# Patient Record
Sex: Male | Born: 1960 | Marital: Single
Health system: Southern US, Community
[De-identification: ages and names within clinical notes are randomized; demographics above are authoritative.]

---

## 2012-09-26 ENCOUNTER — Ambulatory Visit: Payer: Self-pay | Admitting: Internal Medicine

## 2012-09-27 ENCOUNTER — Inpatient Hospital Stay: Payer: Self-pay | Admitting: Internal Medicine

## 2012-09-27 DIAGNOSIS — J9620 Acute and chronic respiratory failure, unspecified whether with hypoxia or hypercapnia: Secondary | ICD-10-CM

## 2012-09-27 DIAGNOSIS — I509 Heart failure, unspecified: Secondary | ICD-10-CM

## 2012-09-27 LAB — CK TOTAL AND CKMB (NOT AT ARMC)
CK, Total: 154 U/L (ref 35–232)
CK-MB: 2.3 ng/mL (ref 0.5–3.6)

## 2012-09-27 LAB — URINALYSIS, COMPLETE
Blood: NEGATIVE
Glucose,UR: NEGATIVE mg/dL (ref 0–75)
Ketone: NEGATIVE
Leukocyte Esterase: NEGATIVE
Nitrite: NEGATIVE
Ph: 7 (ref 4.5–8.0)
Protein: 100
RBC,UR: <1 /HPF (ref 0–5)
Specific Gravity: 1.015 (ref 1.003–1.030)
Squamous Epithelial: NONE SEEN
WBC UR: 1 /HPF (ref 0–5)

## 2012-09-27 LAB — COMPREHENSIVE METABOLIC PANEL
Anion Gap: 4 — ABNORMAL LOW (ref 7–16)
BUN: 10 mg/dL (ref 7–18)
Bilirubin,Total: 0.6 mg/dL (ref 0.2–1.0)
Calcium, Total: 8.5 mg/dL (ref 8.5–10.1)
Co2: 32 mmol/L (ref 21–32)
EGFR (Non-African Amer.): >60
Glucose: 104 mg/dL — ABNORMAL HIGH (ref 65–99)
Osmolality: 247 (ref 275–301)
SGOT(AST): 69 U/L — ABNORMAL HIGH (ref 15–37)
Sodium: 123 mmol/L — ABNORMAL LOW (ref 136–145)

## 2012-09-27 LAB — CBC WITH DIFFERENTIAL/PLATELET
Basophil %: 1 %
Eosinophil #: 0 10*3/uL (ref 0.0–0.7)
HCT: 40.8 % (ref 40.0–52.0)
HGB: 13.6 g/dL (ref 13.0–18.0)
Lymphocyte %: 12.8 %
MCH: 27.2 pg (ref 26.0–34.0)
MCHC: 33.3 g/dL (ref 32.0–36.0)
Neutrophil #: 3.9 10*3/uL (ref 1.4–6.5)
Neutrophil %: 72.8 %
Platelet: 443 10*3/uL — ABNORMAL HIGH (ref 150–440)
RDW: 15.6 % — ABNORMAL HIGH (ref 11.5–14.5)

## 2012-09-27 LAB — TROPONIN I
Troponin-I: <0.02 ng/mL
Troponin-I: 0.03 ng/mL

## 2012-09-27 LAB — CREATININE, URINE, RANDOM: Creatinine, Urine Random: 111.2 mg/dL (ref 30.0–125.0)

## 2012-09-27 LAB — TSH: Thyroid Stimulating Horm: 2.27 u[IU]/mL

## 2012-09-27 LAB — PROTIME-INR: INR: 1.1

## 2012-09-27 LAB — PRO B NATRIURETIC PEPTIDE: B-Type Natriuretic Peptide: 4037 pg/mL — ABNORMAL HIGH (ref 0–125)

## 2012-09-27 LAB — APTT: Activated PTT: 32 secs (ref 23.6–35.9)

## 2012-09-27 LAB — SODIUM, URINE, RANDOM: Sodium, Urine Random: 103 mmol/L (ref 20–110)

## 2012-09-27 LAB — SEDIMENTATION RATE: Erythrocyte Sed Rate: 6 mm/hr (ref 0–20)

## 2012-09-27 LAB — RAPID HIV-1/2 QL/CONFIRM: HIV-1/2,Rapid Ql: NEGATIVE

## 2012-09-28 DIAGNOSIS — I279 Pulmonary heart disease, unspecified: Secondary | ICD-10-CM

## 2012-09-28 LAB — CBC WITH DIFFERENTIAL/PLATELET
Basophil #: 0 10*3/uL (ref 0.0–0.1)
Eosinophil #: 0 10*3/uL (ref 0.0–0.7)
HCT: 38.5 % — ABNORMAL LOW (ref 40.0–52.0)
Lymphocyte %: 3 %
Monocyte #: 0.1 x10 3/mm — ABNORMAL LOW (ref 0.2–1.0)
Monocyte %: 0.9 %
Neutrophil %: 95.6 %
Platelet: 474 10*3/uL — ABNORMAL HIGH (ref 150–440)
RBC: 4.59 10*6/uL (ref 4.40–5.90)
WBC: 6.4 10*3/uL (ref 3.8–10.6)

## 2012-09-28 LAB — BASIC METABOLIC PANEL
Anion Gap: 0 — ABNORMAL LOW (ref 7–16)
Calcium, Total: 8.5 mg/dL (ref 8.5–10.1)
Chloride: 91 mmol/L — ABNORMAL LOW (ref 98–107)
Co2: 37 mmol/L — ABNORMAL HIGH (ref 21–32)
Creatinine: 0.61 mg/dL (ref 0.60–1.30)
EGFR (African American): >60
Glucose: 136 mg/dL — ABNORMAL HIGH (ref 65–99)
Osmolality: 257 (ref 275–301)
Potassium: 4.4 mmol/L (ref 3.5–5.1)

## 2012-09-28 LAB — HEMOGLOBIN A1C: Hemoglobin A1C: 7.5 % — ABNORMAL HIGH (ref 4.2–6.3)

## 2012-09-28 LAB — LIPID PANEL
Cholesterol: 128 mg/dL (ref 0–200)
Ldl Cholesterol, Calc: 85 mg/dL (ref 0–100)
Triglycerides: 45 mg/dL (ref 0–200)
VLDL Cholesterol, Calc: 9 mg/dL (ref 5–40)

## 2012-09-28 LAB — CK TOTAL AND CKMB (NOT AT ARMC): CK-MB: 2.4 ng/mL (ref 0.5–3.6)

## 2012-09-28 LAB — TROPONIN I: Troponin-I: <0.02 ng/mL

## 2012-09-28 LAB — MAGNESIUM: Magnesium: 1.9 mg/dL

## 2012-09-29 LAB — URINALYSIS, COMPLETE
Bacteria: NONE SEEN
Bilirubin,UR: NEGATIVE
Blood: NEGATIVE
Glucose,UR: NEGATIVE mg/dL (ref 0–75)
Ph: 5 (ref 4.5–8.0)
WBC UR: <1 /HPF (ref 0–5)

## 2012-09-29 LAB — BASIC METABOLIC PANEL
BUN: 15 mg/dL (ref 7–18)
Chloride: 94 mmol/L — ABNORMAL LOW (ref 98–107)
EGFR (African American): >60
EGFR (Non-African Amer.): >60
Osmolality: 269 (ref 275–301)
Potassium: 4.3 mmol/L (ref 3.5–5.1)

## 2012-09-29 LAB — CBC WITH DIFFERENTIAL/PLATELET
Comment - H1-Com3: NORMAL
HCT: 36.6 % — ABNORMAL LOW (ref 40.0–52.0)
HGB: 12 g/dL — ABNORMAL LOW (ref 13.0–18.0)
Lymphocytes: 7 %
MCH: 26.8 pg (ref 26.0–34.0)
MCHC: 32.8 g/dL (ref 32.0–36.0)
MCV: 82 fL (ref 80–100)
Monocytes: 4 %
NRBC/100 WBC: 1 /
RDW: 15.8 % — ABNORMAL HIGH (ref 11.5–14.5)
Segmented Neutrophils: 89 %
WBC: 7.7 10*3/uL (ref 3.8–10.6)

## 2012-09-29 LAB — HEPATIC FUNCTION PANEL A (ARMC)
Alkaline Phosphatase: 151 U/L — ABNORMAL HIGH (ref 50–136)
Bilirubin,Total: 0.5 mg/dL (ref 0.2–1.0)
SGOT(AST): 25 U/L (ref 15–37)
SGPT (ALT): 60 U/L (ref 12–78)
Total Protein: 7.5 g/dL (ref 6.4–8.2)

## 2012-09-29 LAB — MAGNESIUM: Magnesium: 1.9 mg/dL

## 2012-09-29 LAB — PHOSPHORUS: Phosphorus: 2.3 mg/dL — ABNORMAL LOW (ref 2.5–4.9)

## 2012-09-30 LAB — MAGNESIUM: Magnesium: 1.9 mg/dL

## 2012-09-30 LAB — PHOSPHORUS: Phosphorus: 2.8 mg/dL (ref 2.5–4.9)

## 2012-10-01 LAB — BASIC METABOLIC PANEL
Anion Gap: 5 — ABNORMAL LOW (ref 7–16)
BUN: 24 mg/dL — ABNORMAL HIGH (ref 7–18)
Calcium, Total: 8 mg/dL — ABNORMAL LOW (ref 8.5–10.1)
Chloride: 100 mmol/L (ref 98–107)
Co2: 31 mmol/L (ref 21–32)
EGFR (African American): >60
EGFR (Non-African Amer.): >60
Osmolality: 280 (ref 275–301)
Potassium: 3.8 mmol/L (ref 3.5–5.1)

## 2012-10-01 LAB — CBC WITH DIFFERENTIAL/PLATELET
Basophil #: 0 10*3/uL (ref 0.0–0.1)
Basophil %: 0.5 %
HCT: 35.6 % — ABNORMAL LOW (ref 40.0–52.0)
Lymphocyte %: 4.1 %
MCH: 26.6 pg (ref 26.0–34.0)
MCHC: 32.7 g/dL (ref 32.0–36.0)
MCV: 81 fL (ref 80–100)
Monocyte #: 0.7 x10 3/mm (ref 0.2–1.0)
Monocyte %: 7.8 %
Neutrophil #: 8.2 10*3/uL — ABNORMAL HIGH (ref 1.4–6.5)
Neutrophil %: 87.6 %
Platelet: 432 10*3/uL (ref 150–440)
RDW: 16.6 % — ABNORMAL HIGH (ref 11.5–14.5)
WBC: 9.4 10*3/uL (ref 3.8–10.6)

## 2012-10-03 LAB — CBC WITH DIFFERENTIAL/PLATELET
Basophil %: 0.1 %
Eosinophil #: 0 10*3/uL (ref 0.0–0.7)
Eosinophil %: 0 %
HCT: 38.1 % — ABNORMAL LOW (ref 40.0–52.0)
HGB: 12.2 g/dL — ABNORMAL LOW (ref 13.0–18.0)
Lymphocyte %: 1.8 %
MCH: 27 pg (ref 26.0–34.0)
MCHC: 32 g/dL (ref 32.0–36.0)
Neutrophil #: 15.2 10*3/uL — ABNORMAL HIGH (ref 1.4–6.5)
Neutrophil %: 91.3 %
Platelet: 414 10*3/uL (ref 150–440)
RDW: 16.3 % — ABNORMAL HIGH (ref 11.5–14.5)
WBC: 16.6 10*3/uL — ABNORMAL HIGH (ref 3.8–10.6)

## 2012-10-03 LAB — BASIC METABOLIC PANEL
Calcium, Total: 8.8 mg/dL (ref 8.5–10.1)
Chloride: 93 mmol/L — ABNORMAL LOW (ref 98–107)
Creatinine: 0.61 mg/dL (ref 0.60–1.30)
EGFR (Non-African Amer.): >60
Glucose: 142 mg/dL — ABNORMAL HIGH (ref 65–99)

## 2012-10-03 LAB — MAGNESIUM: Magnesium: 2.2 mg/dL

## 2012-10-04 LAB — BASIC METABOLIC PANEL
BUN: 22 mg/dL — ABNORMAL HIGH (ref 7–18)
Calcium, Total: 8.6 mg/dL (ref 8.5–10.1)
Co2: 44 mmol/L (ref 21–32)
Creatinine: 0.48 mg/dL — ABNORMAL LOW (ref 0.60–1.30)
Osmolality: 274 (ref 275–301)
Potassium: 4 mmol/L (ref 3.5–5.1)

## 2012-10-05 LAB — BASIC METABOLIC PANEL
BUN: 21 mg/dL — ABNORMAL HIGH (ref 7–18)
Calcium, Total: 8.5 mg/dL (ref 8.5–10.1)
Chloride: 92 mmol/L — ABNORMAL LOW (ref 98–107)
Co2: >45 mmol/L (ref 21–32)
Creatinine: 0.62 mg/dL (ref 0.60–1.30)
EGFR (African American): >60
EGFR (Non-African Amer.): >60
Glucose: 164 mg/dL — ABNORMAL HIGH (ref 65–99)

## 2012-10-05 LAB — CBC WITH DIFFERENTIAL/PLATELET
Basophil #: 0 10*3/uL (ref 0.0–0.1)
Eosinophil %: 0.1 %
HCT: 39.6 % — ABNORMAL LOW (ref 40.0–52.0)
Lymphocyte %: 10.4 %
MCH: 27 pg (ref 26.0–34.0)
MCHC: 31.2 g/dL — ABNORMAL LOW (ref 32.0–36.0)
MCV: 86 fL (ref 80–100)
Monocyte #: 1.5 x10 3/mm — ABNORMAL HIGH (ref 0.2–1.0)
Monocyte %: 18.8 %
Platelet: 387 10*3/uL (ref 150–440)

## 2012-10-05 LAB — TROPONIN I: Troponin-I: <0.02 ng/mL

## 2012-10-08 LAB — BASIC METABOLIC PANEL
Chloride: 97 mmol/L — ABNORMAL LOW (ref 98–107)
Co2: 44 mmol/L (ref 21–32)
Creatinine: 0.55 mg/dL — ABNORMAL LOW (ref 0.60–1.30)
EGFR (African American): >60
EGFR (Non-African Amer.): >60
Glucose: 95 mg/dL (ref 65–99)
Osmolality: 279 (ref 275–301)

## 2012-10-08 LAB — CBC WITH DIFFERENTIAL/PLATELET
Basophil #: 0 10*3/uL (ref 0.0–0.1)
Eosinophil #: 0.1 10*3/uL (ref 0.0–0.7)
HGB: 12.3 g/dL — ABNORMAL LOW (ref 13.0–18.0)
Lymphocyte #: 1 10*3/uL (ref 1.0–3.6)
MCH: 27 pg (ref 26.0–34.0)
MCHC: 31.3 g/dL — ABNORMAL LOW (ref 32.0–36.0)
MCV: 86 fL (ref 80–100)
Monocyte #: 0.9 x10 3/mm (ref 0.2–1.0)
Platelet: 328 10*3/uL (ref 150–440)
RBC: 4.54 10*6/uL (ref 4.40–5.90)
WBC: 7.5 10*3/uL (ref 3.8–10.6)

## 2012-10-27 ENCOUNTER — Ambulatory Visit: Payer: Self-pay | Admitting: Internal Medicine

## 2014-06-18 NOTE — H&P (Signed)
PATIENT NAME:  Ronald Galvan, Ronald Galvan MR#:  [id number] DATE OF BIRTH:  Oct 05, 1960  DATE OF ADMISSION:  09/27/2012  PRIMARY CARE PHYSICIAN: Nonlocal.  REFERRING PHYSICIAN: Arman Filter, MD   CHIEF COMPLAINT: Shortness of breath.   HISTORY OF PRESENT ILLNESS: The patient is a 54 year old African-American male with history of asthma, hypertension, GERD, who is visiting from another part of [place]. He came in through the ER after experiencing some weakness and shortness of breath yesterday. The patient stated that he has been having weight loss of about 20 pounds in the last couple of years, without cough, night sweats, fevers, chills or rigors. He has been worked up as an outpatient and has had a couple of HIV tests this year, per him, both negative. "They don't know what's going on with me." In the meantime, he is explaining dyspnea on exertion and occasionally cannot even go up a flight of stairs. He has no productive cough. He has no significant lower extremity edema. He tires easily. He has been having increased shortness of breath for the last 3 days or so, again without fevers, coughs. There are no sick contacts or recent antibiotics. There is occasional wheezing. Yesterday, he felt significantly worse and had dizziness, sweats and was short of breath. He also stated that he had perhaps 1- to 2-second slurred speech, which improved by itself, without any associated weakness. He came into the hospital today. While he was in the waiting room, per ER physician, his pulse oximetry was 60%. He was brought in and placed on 3 liters of oxygen, and he is better. Workup done thus far showed a BNP of 4000, sodium of 123 and magnesium of 1.2, and hospitalist services were contacted for further evaluation and management. Also, he had a D-dimer of 2.9, and a CAT scan has been ordered and done, result pending.   PAST MEDICAL HISTORY: Hypertension, asthma, GERD.   PAST SURGICAL HISTORY: Denies.   FAMILY HISTORY:  Cancer of the breast in the mom, prostate issues in male members of his family.   ALLERGIES: None.   SOCIAL HISTORY: Denies tobacco, alcohol or drug use. Works as a Journalist, newspaper.   OUTPATIENT MEDICATIONS: He is on:  1. Advair 250/50 mcg 1 puff 2 times a day.  2. Nexium 20 mg daily. 3. One blood pressure medication, he does not know what.   REVIEW OF SYSTEMS:  CONSTITUTIONAL: A 20-pound weight loss or so in the last 2 to 3 years, without night sweats, fevers, chills. He generally has poor appetite.  EYES: No blurry vision, double vision, redness or cataracts.  EARS, NOSE, THROAT: No tinnitus, hearing loss. Had some dry throat in the last week or so.   RESPIRATORY: No cough. Occasional wheezing. Has significant dyspnea on exertion. No history of COPD. Does have history of asthma. No painful respirations.  CARDIOVASCULAR: Denies chest pains. No orthopnea. No swelling in the legs. No arrhythmia. Has significant dyspnea on exertion. No palpitations. Has high blood pressure. Had an issue with the heart where he has seen a cardiologist in the past, and the cardiologist told him he had some scarring around his heart, questionable infection, but was not given any antibiotics and was not hospitalized.  GASTROINTESTINAL: Has no nausea or vomiting. Has constipation. No abdominal pain. No rectal bleeding or melena.  GENITOURINARY: He has no dysuria or hematuria.  HEMATOLOGIC AND LYMPHATIC: No anemia or easy bruising.  SKIN: No rashes.  MUSCULOSKELETAL: Denies arthritis or gout.  NEUROLOGIC: Denies focal  weakness, numbness, stroke or TIA.  PSYCHIATRIC: Has anxiety.   PHYSICAL EXAMINATION:  VITAL SIGNS: Temperature on arrival is 97.6, initial pulse rate was 85, while I was in the room it was in the low 100s, respiratory rate 18, blood pressure on arrival 164/93, O2 saturation charted here was 87% on room air, but per Dr. Renard Hamper, ER physician, he was about 60 while waiting in the  waiting room.  GENERAL: The patient is a cachectic-appearing thin African-American male lying in bed flat, without obvious distress.  HEENT: Normocephalic, atraumatic. Pupils are equal and reactive. Anicteric sclerae. Extraocular muscles intact. There is some temporal wasting. Moist mucous membranes. There is no evidence of thrush.  NECK: Supple. No thyroid tenderness, but there is significant distention of neck veins and some JVD.  CARDIOVASCULAR: S1, S2, tachycardic. No significant murmurs, rubs or gallops appreciated.  LUNGS: There are decreased breath sounds in all fields. No significant wheezing or crackles, but the hyperdynamic heart sounds are heard throughout the lung fields.  ABDOMEN: Soft, nontender, nondistended. Positive bowel sounds in all quadrants.  EXTREMITIES: No significant lower extremity edema.  SKIN: No obvious rashes. NEUROLOGIC: Cranial nerves II through XII grossly intact. Strength is 5 out of 5 in all extremities. Sensation is intact to light touch.  PSYCHIATRIC: Awake, alert, oriented x3. Cooperative, pleasant.  LABORATORY DATA: BNP 4037. Glucose 104, BUN 10, creatinine 0.55, sodium 123, potassium 4.8, chloride 87, magnesium 1.2, anion gap of 4, total protein 9.2, serum albumin 2.7, alkaline phosphatase 191, AST 69, ALT 91. Troponin negative. TSH 2.27. WBC 5.4. ESR is 6. Hemoglobin 13.6, platelets are 443. D-dimer 2.9. INR 1.1. UA not suggestive of infection. ABG on 2 liters of oxygen showed pH of 7.24, pCO2 of 80, pO2 of 81. CT of the brain without contrast showed involutional changes without evidence of focal acute abnormality. X-ray of the chest shows findings likely representing interstitial fibrosis. No focal regions of consolidation appreciated. EKG: Sinus rate of 89. Bilateral atrial enlargement, pulmonary disease pattern. There are some T wave inversions in inferior leads and some also in V3, no acute ST elevations. Prolonged QT interval.   ASSESSMENT AND PLAN: We have  a 54 year old with hypertension, asthma, gastroesophageal reflux disease, with what appears to be chronic and progressive shortness of breath and dyspnea on exertion, with weight loss, who presents with chronic shortness of breath and more acute worsening in the last 3 days associated with dizziness and shortness of breath. I did attempt to call his clinic, but they were closed, but I got in touch with a PA, who was unfortunately not familiar with the patient and could not give me any records, medical conditions or outpatient medications. He appears to have acute on possibly chronic hypercapnic hypoxic respiratory failure. X-ray of the chest shows fibrosis. At this point, respiratory failure is of unknown etiology, but workup is being done. Amongst the differential are such things as Eisenmenger's syndrome, some type of pericarditis, human immunodeficiency virus and Pneumocystis pneumonia and pulmonary fibrosis. Also possible is a pulmonary embolism.   In regards to the acute on chronic hypercapnic hypoxic respiratory failure, I would supplement oxygen. He does not appear to be grossly volume overloaded to me on exam, but has diminished breath sounds and no significant lower extremity edema. He has need of extensive workup, including following up the CAT scan. I would admit the patient to telemetry, obtain a cardiology consult, obtain an echocardiogram. I would also consider low-dose Lasix if needed, but the patient has had  poor p.o. intake and has some hyponatremia, and that needs to be worked up too. In the meantime, for HIV, I have ordered HIV antibodies, and also a CD4 count has been ordered, but it cannot be done until Monday. Of note, the patient has had cachexia, weight loss and hypoxia which is severe, but lung x-ray shows more of a pulmonary fibrosis, and per patient, he has had 2 negative HIV tests, and that is less likely on the differential. Interstitial pulmonary fibrosis is a possibility. I would  follow with the CAT scan and obtain a pulmonary consult. I would also send in labs, including ANCA, ANA, ACE level, supply oxygen, and I have ordered a BiPAP. Will also continue with Advair. The patient might need steroids if this is interstitial fibrosis. I would also recheck another ABG and lactate later today and consider BiPAP at that time.   The patient does have hyponatremia. He has had poor p.o. intake for the last couple of days, but I suspect there is chronicity of this as well. The patient appears to be more of euvolemic to slightly hypervolemic and might consider giving the patient Lasix. We will go ahead and send in serum and urine OSMs, calculate fractional excretion of sodium, check a urine sodium and creatinine. As I believe that this is more of a chronic issue perhaps, I would not be in haste to give the patient IV fluids at this point or significant doses of Lasix. I would consider low-dose Lasix as the patient does have some elements of congestive heart failure, which could be driven by the lungs and pulmonary fibrosis. I would replete magnesium and recheck another in the morning. In regards to the high blood pressure, he does not know what medication he is on. I would consider starting low-dose beta blocker or an ACE inhibitor. Continue his PPI. I will start him on heparin for deep venous thrombosis prophylaxis. The patient does have significant malnutrition, and I would think it is at least moderate. I would obtain a dietary consult. He has elevated LFTs, which could be secondary to hepatic congestion and right-sided heart failure. We would monitor them at this point.   TOTAL CRITICAL CARE TIME SPENT: 75 minutes.   CODE STATUS: The patient is full code.   ____________________________ Vivien Presto, MD sa:OSi D: 09/27/2012 14:08:01 ET T: 09/27/2012 14:55:35 ET JOB#: [id number]  cc: Vivien Presto, MD, <Dictator> Vivien Presto MD ELECTRONICALLY SIGNED 10/19/2012 12:27

## 2014-06-18 NOTE — Consult Note (Signed)
Brief Consult Note: Diagnosis: Severe lung disease with likely pulmonary fibrosis, Low CD4, + SPEP with monoclonal IGG, + RF.   Patient was seen by consultant.   Consult note dictated.   Recommend further assessment or treatment.   Orders entered.   Discussed with Attending MD.   Comments: Low Cd4 count - % is normal - likley due to acute illness  - rec repeat cd4 Severe lung disease - doubt active infeciton - may have been related to prior infection or chemical exposure. For the + IGG monoclonal spike on spep could consider Onc consult.  Electronic Signatures: Dierdre HarnessFitzgerald, Sahirah Rudell Patrick (MD)  (Signed 12-Aug-14 13:00)  Authored: Brief Consult Note   Last Updated: 12-Aug-14 13:00 by Dierdre HarnessFitzgerald, Aella Ronda Patrick (MD)

## 2014-06-18 NOTE — Consult Note (Signed)
PATIENT NAME:  Ronald Galvan, Ronald Galvan MR#:  [id number] DATE OF BIRTH:  12-26-1960  DATE OF CONSULTATION:  10/07/2012  REFERRING PHYSICIAN: Dr. Boyce Medici CONSULTING PHYSICIAN:  Cheral Marker. Ola Spurr, MD  PULMONOLOGIST: Dr. Mortimer Fries.  REASON FOR CONSULTATION: Severe pneumonia as well as low CD4 count.   HISTORY OF PRESENT ILLNESS: This is an unfortunate 54 year old nonsmoker, who was admitted 08/02 with complaints of shortness of breath and weakness. He had also had a slow progressive weight loss over several years. When he was admitted, he was found to be dyspneic and had hypoxia to 60% on room air. He was admitted for further management.   Since that time, the patient has had a rocky course with his pulmonary status having great difficulty ventilating requiring intermittent BiPAP. He has been seen by Dr. Raul Del and then by Dr. Mortimer Fries currently. He was on BiPAP last night.   When I spoke with the patient this morning he is a relatively poor historian. I asked how long he has been having his symptoms and he says he is only felt "funky" for a few days and that is what brought him in.  On further discussion when asked about his dyspnea on exertion, it has been going on for at least several months. He denies any night sweats or fevers. He denies any real productive cough. He does have intermittent headaches. He denies any other symptoms.   PAST MEDICAL HISTORY:  1.  Hypertension.  2.  Asthma.  3.  GERD.   PAST SURGICAL HISTORY: He denies.   FAMILY HISTORY: Positive for cancer in his mother and prostate cancer in his family as well.   ALLERGIES: NO KNOWN DRUG ALLERGIES.   SOCIAL HISTORY: The patient denies that he ever smoked. He denies alcohol or drug use. He used to work washing windows. He is originally from the [place], [place]. He denies any history of TB or prior TB contacts. He has not traveled outside of the country. There is no animal exposure. It is reported that he was homeless in the past, but he denies  that. He is currently here in [place]. His mother is in the room with him.   REVIEW OF SYSTEMS: 11 systems reviewed and negative except as per HPI.   MEDICATIONS: Antibiotics. It does not appear the patient received any antibiotics since admission. Other medications are diltiazem, prednisone, pantoprazole, subcutaneous heparin, albuterol, ipratropium, Pulmicort, Flovent and Lasix.  PHYSICAL EXAMINATION:  GENERAL: The patient is very thin male lying in bed. He is having some respiratory distress. He has marked JVD when sitting up in bed.  HEENT: Pupils equal, round, reactive to light and accommodation. Extraocular movements are intact. Sclerae anicteric. Oropharynx he has no thrush or oral lesions.  NECK: Supple. He has no lymphadenopathy. He does have marked JVD.   HEART: Hyperdynamic. Regular.  LUNGS: He has decreased breath sounds bilaterally with some mild rhonchi bilaterally.  ABDOMEN: Soft, thin, nontender, nondistended. No hepatosplenomegaly.  EXTREMITIES: No clubbing, cyanosis or edema.  NEURO: He is a poor historian, but is alert, interactive and oriented.   DATA: Basic metabolic panel is normal except slightly elevated glucose 164. Creatinine 0.62. White blood count on 08/10 was 7.9, hemoglobin 12.4 and platelets of 387. He does have a mild decrease in lymphocytes at 0.8. Troponins were negative. HIV test was negative. SPEP showed IgG monoclonal protein with Kappa light chain specificity. Urinalysis was normal. CD4 absolute count was 268 with a percent of 38.3. CPK was 132. TSH was normal at 0.72.  Hemoglobin A1c was 7.5. Hepatitis C antibody was negative. Echocardiogram showed normal LV function, but severe tricuspid regurgitation and severely elevated pulmonary hypertension. Rheumatoid factor was 27.8 positive. Anti-GBM was 3. ANCA testing was negative. ACE level was 37, which is normal. ANA comprehensive panel was negative. CT of the chest showed severe interstitial fibrotic changes  within the lung and small effusions at the base.   IMPRESSION: A 54 year old gentleman with diabetes, who has a severe idiopathic pulmonary fibrosis picture as well as severe elevated right-sided pressures, likely from cor pulmonale. He had a negative HIV test x 2, but has had a borderline low CD4 count. He has not had fevers since admission. He has a normal white count. He does have a positive SPEP  RECOMMENDATIONS:  1.  For the low CD4 count, I believe this is likely just from acute illness. His CD4 percent was relatively normal at 32. I would recommend repeating this today. There is an entity called idiopathic CD4 lymphopenia, but that would be quite unusual. The negative HIV RNA confirms the lack of HIV infection.  2.  For the severe pulmonary process, I discussed with Dr. Mortimer Fries. This is likely multifactorial. It could be due to prior pneumonia or chemical exposure. I do not see any evidence of active infection including no evidence of TB. The elevated monoclonal antibody in the SPEP could raise the possibility of a malignant cause. I would consider a consult from oncology.  3.  I would continue holding off on antibiotics at this time as he has not had fevers or elevated white count. He has no sputum production either. I discussed with Dr. Mortimer Fries possibly doing a bronchoscopy, but the patient would likely not survive that or would need to be intubated with bronchoscopy.  4.  Thank you for the consult. I will be glad to follow with you.  ____________________________ Cheral Marker. Ola Spurr, MD dpf:aw D: 10/07/2012 12:59:39 ET T: 10/07/2012 13:35:25 ET JOB#: [id number]  cc: Cheral Marker. Ola Spurr, MD, <Dictator> Orlean Holtrop Ola Spurr MD ELECTRONICALLY SIGNED 10/07/2012 20:46

## 2014-06-18 NOTE — Discharge Summary (Signed)
PATIENT NAME:  Ronald Galvan, Ronald Galvan MR#:  [id number] DATE OF BIRTH:  1960-05-08  DATE OF ADMISSION:  09/27/2012 DATE OF DISCHARGE:  10/08/2012  For earlier hospital course from admission until 08/08 please see interim discharge summary done by Dr. Krystal Eaton on 08/08. This discharge summary covers final hospital course from 08/09 until 08/13, discharge day.  DISCHARGE DIAGNOSES:  1.  Acute respiratory failure due to idiopathic pulmonary fibrosis.  2.  Tachycardia due to hypoxia.  3.  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 4.  Severe cachexia.   CODE STATUS: FULL CODE.   DISCHARGE MEDICATIONS: 1.  Prednisone 10 mg oral tablet start 60 mg and taper x 5 mg daily until complete.  2.  Advair Diskus 250 mcg 1 puff inhaled 2 times a day.  3.  Diltiazem 120 mg once a day.  4.  Budesonide 118 mcg inhalation powder 1 puff 2 times a day.  5.  Proventil HFA 2 puffs 4 times a day as needed for shortness of breath.  DIET ON DISCHARGE: Regular. Dietary supplement: Ensure. Advised to have 2 times per day. Diet consistency: Regular. Thin liquid. General aspiration precautions. Small bites. Sit upright when eating.   OTHER ADVICE:  The patient was planning to move to [place] permanently and advised to seek PMD as soon as possible there.   HOSPITAL COURSE: After 08/08: The patient had a long course and was intubated initially and then extubated. On 08/09, the patient was transferred to regular floor and continued on nasal cannula oxygen supplementation, and the patient was doing fine and gradually started tolerating physical therapy and walking to the bathroom also. On 08/11, the patient had an episode of getting more drowsy and very less responsive. ABG was done which showed severe hypercapnia and critical care consult and palliative care consult were called in. The patient was started on BiPAP, and he had significant improvement in his overall respiratory and mental status with correction of  hypercapnia.  On the next day, the patient was tolerating nasal cannula oxygen supplementation, and we discussed the discharge planning with the patient. Physical therapy suggested to go to SNF, but as the patient's mother lives in [place] and he was planning to move over there permanently he refused to have SNF service over here and wanted to go home. We checked the patient to check his saturation without having any supplemental oxygen, and the patient was able to tolerate room air with oxygen saturation maintained to 88 while walking and 92 to 93  while at rest and he was comfortable so there was no need of oxygen at discharge and we provided him with inhalers and medication, which are good for a week supply. Advised him to follow with a doctor in [place] as soon as possible and try to arrange for physical therapy and further work-up and care over there with help of his PMD.  CONSULTANTS: In the hospital: Dr. Belia Heman and Dr. Harvie Junior.  IMPORTANT LABORATORY AND DIAGNOSTICS: Creatinine was 0.62.  ABG on 08/11:  PH was 7.26, pCO2 was more than 128 and pO2 was 88 with FiO2 28%, nasal cannula oxygen supplementation and after giving him BiPAP, after a few hours, his ABG was corrected. PH was 7.45, pCO2 was 79 and pO2 was 103 on 35% FiO2, on BiPAP.  CD4 count was 553 on 08/13.  TIME SPENT ON THIS DISCHARGE: 40 minutes. ____________________________ Hope Pigeon Elisabeth Pigeon, MD vgv:sb D: 10/10/2012 07:51:07 ET T: 10/10/2012 09:30:56 ET JOB#: [id number]  cc: Hope Pigeon. Elisabeth Pigeon,  MD, <Dictator> Altamese DillingVAIBHAVKUMAR Rasheka Denard MD ELECTRONICALLY SIGNED 10/28/2012 0:42

## 2014-06-18 NOTE — Consult Note (Signed)
Brief Consult Note: Diagnosis: Severe pulmonary fibrosis?! likely with associated tricuspid regrugiation, right sided heart failure and pulmonary hypertension.   Patient was seen by consultant.   Consult note dictated.   Discussed with Attending MD.   Comments: Will also need to evaluate for possible consitrictive pericarditis/restrictive cardiomyopathy given severely elevated JVD with prominent Y decesnt.  Agree with current work up for pulmonary fibrosis.  An echo was ordered which should be very helpful in sorting out the above.  Electronic Signatures: Lorine BearsArida, Muhammad (MD)  (Signed 02-Aug-14 14:59)  Authored: Brief Consult Note   Last Updated: 02-Aug-14 14:59 by Lorine BearsArida, Muhammad (MD)

## 2014-06-18 NOTE — Op Note (Signed)
PATIENT NAME:  Ronald Galvan, Trayvon MR#:  [id number] DATE OF BIRTH:  [birth year]  DATE OF PROCEDURE:  09/28/2012  PREOPERATIVE DIAGNOSES:  1. Respiratory failure requiring intubation.  2. Hypotension.  3. Pulmonary fibrosis.   POSTOPERATIVE DIAGNOSES:  1. Respiratory failure requiring intubation.  2. Hypotension.  3. Pulmonary fibrosis.   PROCEDURES PERFORMED:  1. Ultrasound-guided insertion of a left radial artery arterial line.  2. Ultrasound-guided insertion of a right internal jugular triple-lumen catheter.   SURGEON: Renford DillsGregory G. Schnier, M.D.   INDICATION: The patient is critically ill in the intensive care unit. He is hypotensive. He has just required intubation for respiratory support. His clinical course is requiring multiple IV medications for continued life support and he is in need of arterial monitoring. The risks and benefits were reviewed. Consent has been obtained.   PROCEDURE: The patient is positioned in the intensive care unit with his left wrist extended palm upward. It is prepped and draped in a sterile fashion. Ultrasound is placed in a sterile sleeve. The radial artery is identified. It is pulsatile and echolucent, indicating patency. Lidocaine 1% is infiltrated and under direct visualization, a 20-gauge Arrow arterial line catheter is inserted. A single puncture is made. Excellent blood flow is returned. Good tracing is noted. The catheter is then secured to the skin of the wrist with a 2-0 silk, and a sterile dressing is applied.   The patient's right neck is then prepped and draped in a sterile fashion. Ultrasound is placed in a sterile sleeve. Jugular vein is identified. It is compressible, indicating patency. Image is recorded, and the Seldinger needle is inserted into the jugular vein. J-wire is advanced. Dilator is passed over the wire, and the triple-lumen catheter is fed without difficulty. It is then secured to the skin with 2-0 silk, and a sterile dressing is applied. All  3 lumens aspirate and flush easily. There are no immediate complications. Chest x-ray is pending.   ____________________________ Renford DillsGregory G. Schnier, MD ggs:gb D: 09/28/2012 16:28:20 ET T: 09/28/2012 21:16:01 ET JOB#: [id number]  cc: Renford DillsGregory G. Schnier, MD, <Dictator> Renford DillsGREGORY G SCHNIER MD ELECTRONICALLY SIGNED 10/07/2012 8:51

## 2014-06-18 NOTE — Consult Note (Signed)
PATIENT NAME:  YORDIN, RHODA MR#:  [id number] DATE OF BIRTH:  October 09, 1960  DATE OF CONSULTATION:  09/27/2012  REFERRING PHYSICIAN:  Krystal Eaton, MD CONSULTING PHYSICIAN:  Glendene Wyer A. Kirke Corin, MD  REASON FOR CONSULTATION: Possible right-sided heart failure.   HISTORY OF PRESENT ILLNESS: This is a 54 year old African American male with past medical history of hypertension, asthma and gastroesophageal reflux disease. He is originally from [place] and moved to [place] about 4 years ago. He has been living in the [place] area for the last few years and was visiting a friend here in [place]. He has been feeling fatigued with dyspnea and progressive weight loss over the last 3 to 6 months, which got worse today with increased shortness of breath and dizziness. He appears to be cachectic. He does not seem to be able to provide accurate history. He informed Dr. Jacques Navy that he did have previous history of infection around his heart. He cannot elaborate on that. He denies any chest pain. He is not aware of any other previous cardiac history. He has no history of congestive heart failure. The patient was noted to be mildly hypoxic. He is mildly tachycardic. ABG showed significant respiratory acidosis with hypercapnia and CO2 of 80. His D-dimer was elevated. Chest x-ray showed pulmonary fibrosis. CT scan showed no evidence of pulmonary embolism, but there was extensive pulmonary scarring and fibrosis of unclear etiology. He is hyponatremic with a sodium of 123. Renal function is normal.   PAST MEDICAL HISTORY: 1.  Hypertension.  2.  Asthma.  3.  Gastroesophageal reflux disease.   HOME MEDICATIONS: Include 1 blood pressure medication.   ALLERGIES: No known drug allergies.   SOCIAL HISTORY: He denies any smoking, alcohol or recreational drug use. He is heterosexual and sexually active but does not have a partner.   FAMILY HISTORY: Negative for premature coronary artery disease.   REVIEW OF  SYSTEMS: A 10-point review of systems was performed. It is negative other than what is mentioned in the HPI.   PHYSICAL EXAMINATION: GENERAL: The patient is cachectic. He appears to be slightly lethargic. He seems to be older than his stated age.  VITAL SIGNS: Heart rate is 100. Blood pressure is 170/90.  HEENT: Normocephalic, atraumatic.  NECK: There is significantly elevated jugular venous pressure with prominent Y descent.  RESPIRATORY: Normal respiratory effort with no use of accessory muscles. Auscultation reveals diminished breath sounds bilaterally with faint crackles.  CARDIOVASCULAR: Normal PMI. Normal S1 and S2 with no gallops. There is a II/VI holosystolic murmur at the left sternal border suggestive of tricuspid regurgitation.  ABDOMEN: Benign, nontender and nondistended with mild hepatomegaly.  EXTREMITIES: No clubbing, cyanosis or edema.  SKIN: Warm and dry with no rash.  PSYCHIATRIC: He is slightly lethargic and seems to have a flat affect.   LABORATORY DATA: His BNP was 4000. Renal function is normal, but sodium is 123. Albumin is 2.7 with mildly elevated SGOT and SGPT. Troponin is less than 0.02. Thyroid function is normal. CBC is normal except for a platelet count of 443,000. D-dimer was 2.9. INR is 1.1. ABG showed a pH of 7.24 with a CO2 of 80 and pO2 of 81.   IMPRESSION: 1.  Acute on chronic respiratory failure, likely due to end-stage lung disease.  2.  Right-sided heart failure, likely with severe pulmonary hypertension.  3.  Cachexia of unclear etiology.   RECOMMENDATIONS: The patient appears to be critically ill. His presentation seems to be subacute and has been progressive over  the last few months with associated weight loss. By physical exam, he seems to have evidence of right-sided heart failure with tricuspid regurgitation. CT scan of the chest showed no evidence of pulmonary embolism, but there was evidence of extensive pulmonary fibrosis. I agree with the current  work-up for pulmonary fibrosis. The other differential diagnosis for his cardiac findings on exam include constrictive pericarditis and restrictive cardiomyopathy. An echocardiogram was ordered, which will greatly help in differentiating the above. In terms of his volume status, I recommend not diuresing him or hydrating him at this time until further work-up is obtained. He does not seem to have significant lower extremity edema. Also consider work-up for HIV and tuberculosis. Further recommendations to follow after the above.   ____________________________ Chelsea AusMuhammad A. Kirke CorinArida, MD maa:jm D: 09/27/2012 15:12:38 ET T: 09/27/2012 16:10:59 ET JOB#: [id number]  cc: Lennon Boutwell A. Kirke CorinArida, MD, <Dictator> Krystal EatonShayiq Ahmadzia, MD Jerolyn CenterMUHAMMAD Argentina DonovanA Dorenda Pfannenstiel MD ELECTRONICALLY SIGNED 10/16/2012 7:45

## 2014-07-14 IMAGING — CT CT CHEST W/ CM
1 series · 15 of 32 positions shown, 19 images · non-contrast
Comparison: none

REASON FOR EXAM: hypoxia sob
COMMENTS:

PROCEDURE:     CT  - CT CHEST (FOR PE) W  - September 27, 2012 [DATE]
RESULT:     Chest CT dated 10/05/2012
TECHNIQUE: Helical 3 mm sections were obtained from the thoracic inlet
through the lung bases status post intravenous administration of 75 mL of
[id number].

[Series 4: soft tissue · axial · 0.68mm/px · z∈[-124,+128]mm · 15 of 96 slices shown, 19 images]
[im 8/96  mediastinal]
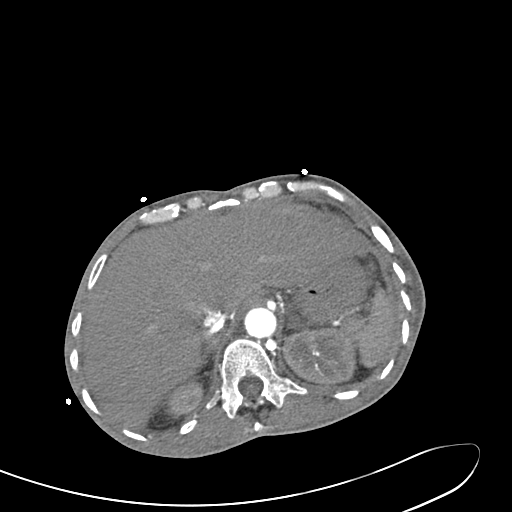
[im 8/96  lung]
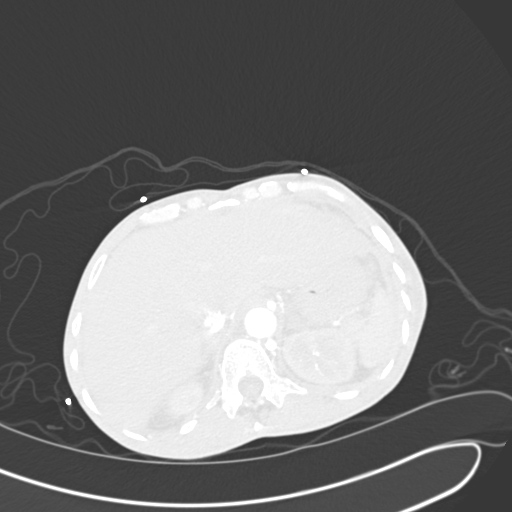
[im 15/96  lung]
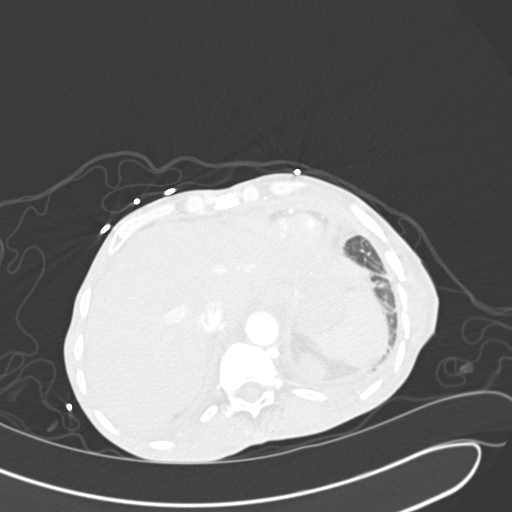
[im 20/96  lung]
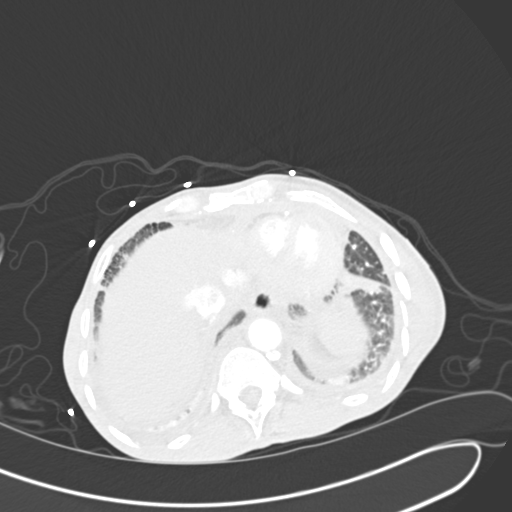
[im 25/96  lung]
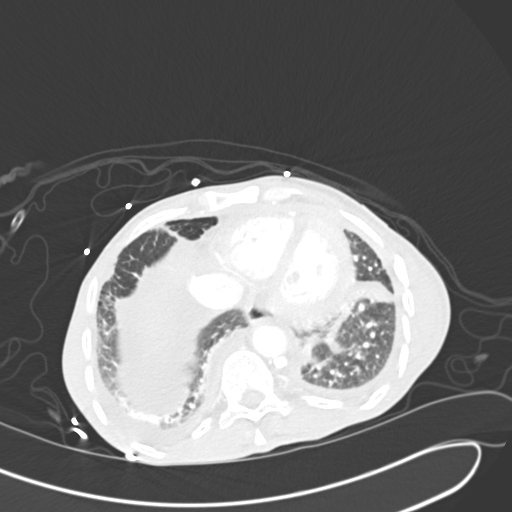
[im 32/96  mediastinal]
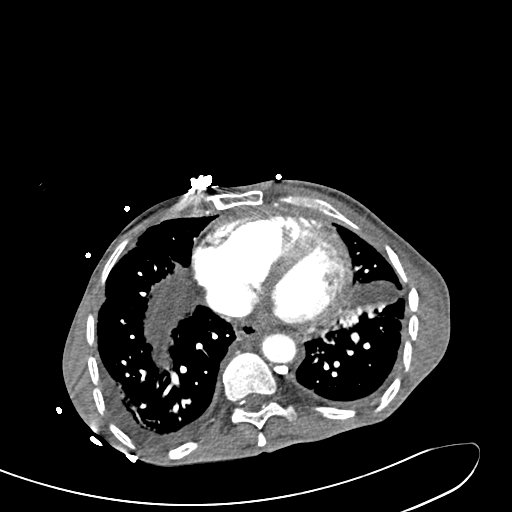
[im 32/96  lung]
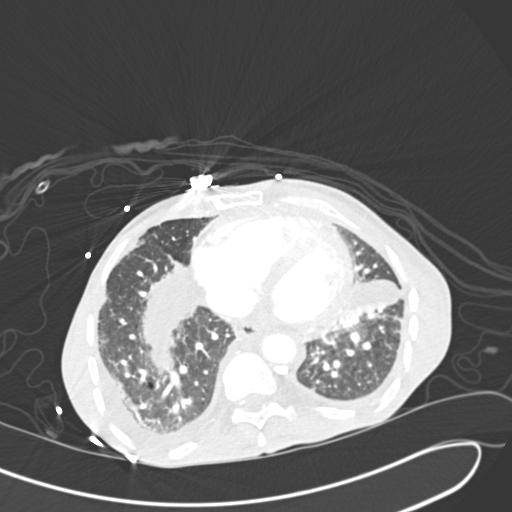
[im 39/96  lung]
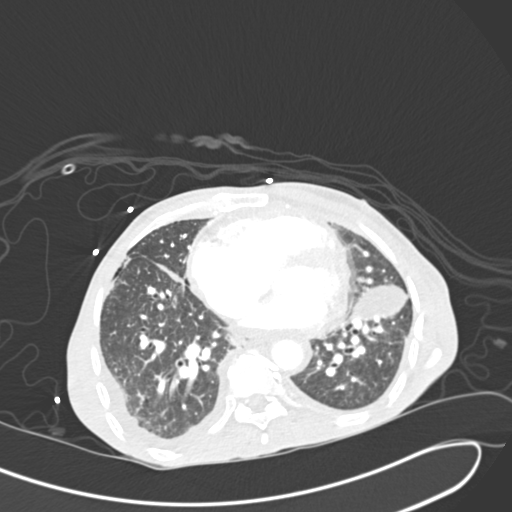
[im 46/96  lung]
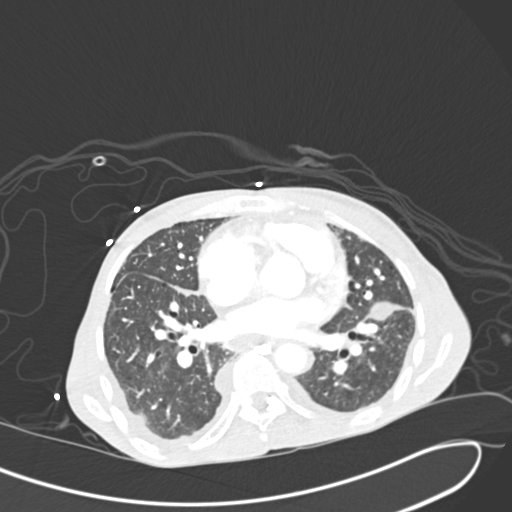
[im 51/96  lung]
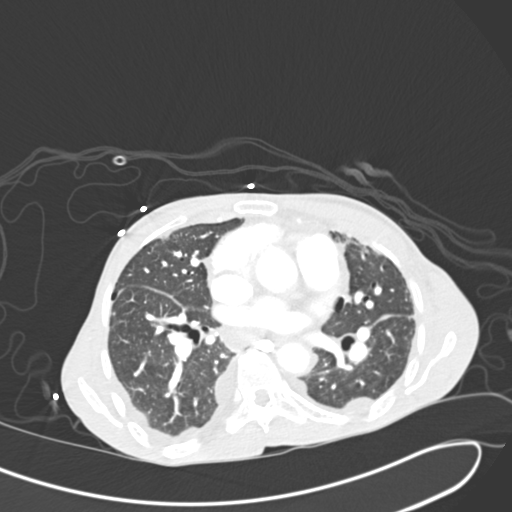
[im 57/96  mediastinal]
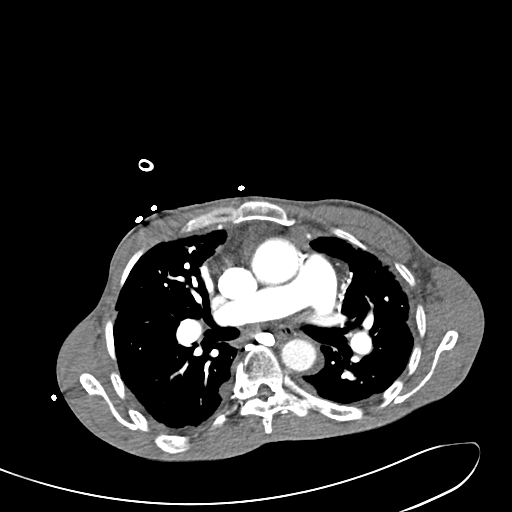
[im 57/96  lung]
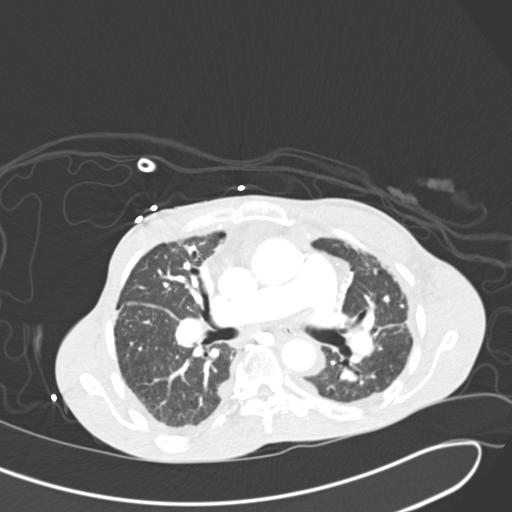
[im 60/96  lung]
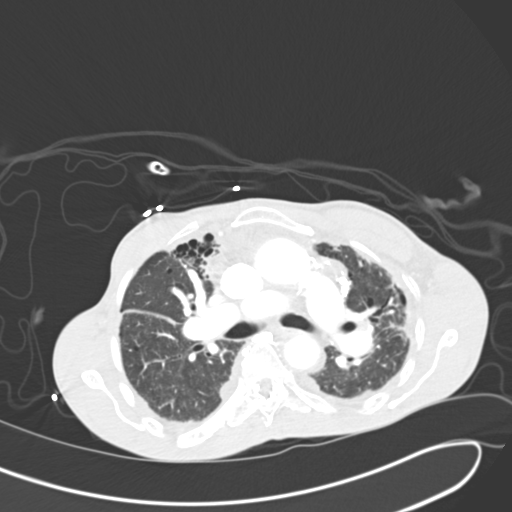
[im 67/96  lung]
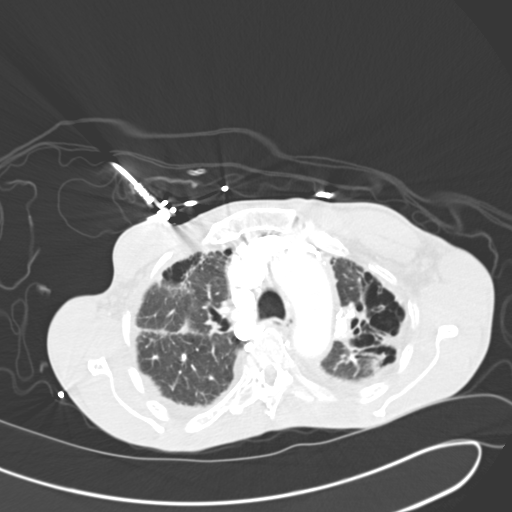
[im 74/96  lung]
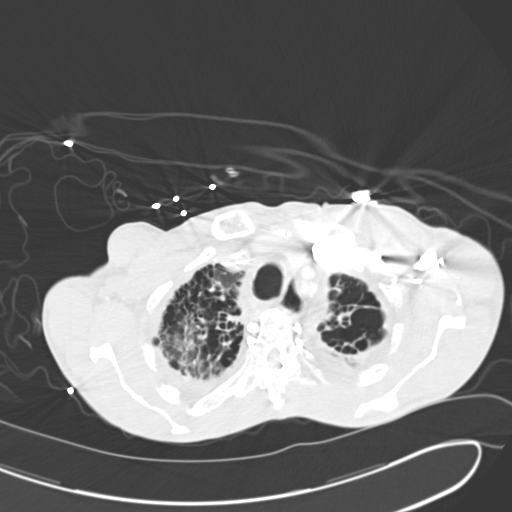
[im 78/96  mediastinal]
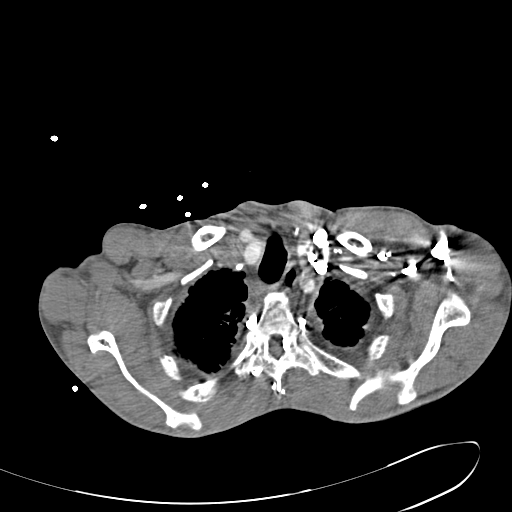
[im 78/96  lung]
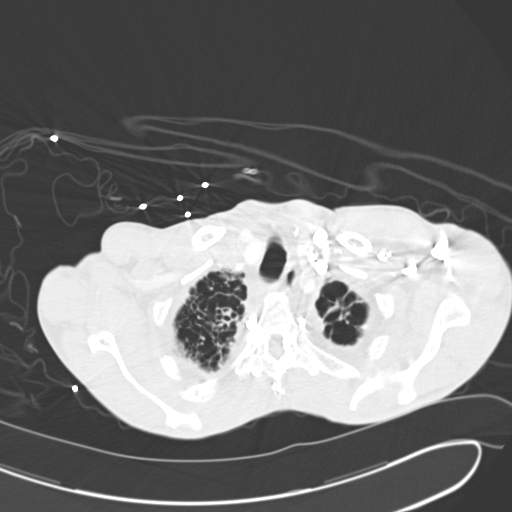
[im 85/96  lung]
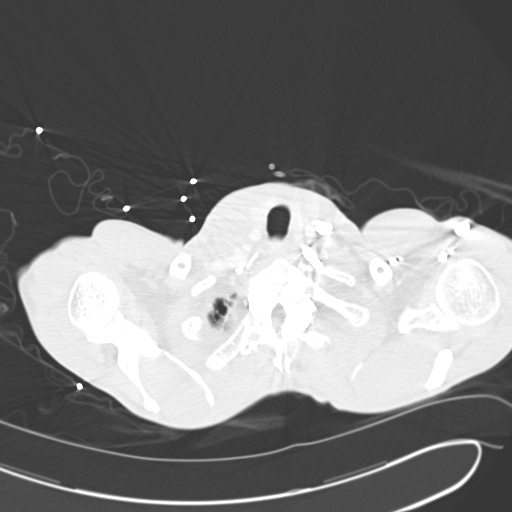
[im 92/96  lung]
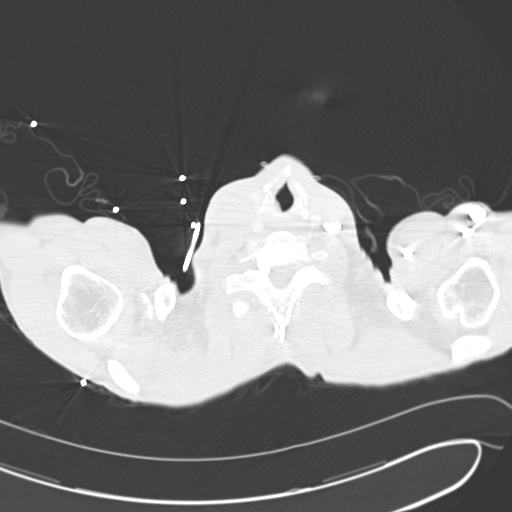

[15 of 32 positions shown; findings below may reference images not displayed]

FINDINGS: Evaluation mediastinum demonstrates findings which may represent
prior stent placement in the left coronary artery system. There also appear
to be opacified venous lateral vessels within the mediastinum. The there is
a small pericardial effusion. Amorphous increased density projects within
the anterior mediastinum. No definite evidence a further mediastinal masses
or adenopathy is appreciated. There is no evidence of filling defects within
the main, lobar, or segmental pulmonary arteries. A small pleural pleural
effusion identified within the base of the right lower lobe. Fluid is also a
appear tracking along the base of the left lower lobe fissure. There is
extensive interlobular and subpleural septal thickening as well as
honeycombing within the lung apices. Diffuse groundglass density is
identified throughout the remaining portions of the lungs as well as areas
of bronchiectasis. Ill-defined areas of increased density project within the
lung bases.

There is no evidence of a filling defect within the main, lobar, or
segmental pulmonary arteries. There is no evidence of a thoracic aortic
aneurysm nor dissection.

Visualized upper abdominal viscera demonstrate no gross abnormalities.
IMPRESSION: 1. No CT evidence of pulmonary to embolic disease.
2. Severe interstitial fibrotic changes within the lungs
3. Small effusions within the lung bases.

## 2014-07-14 IMAGING — CT CT HEAD WITHOUT CONTRAST
1 series · 16 of 30 positions shown, 20 images · non-contrast
Comparison: none

REASON FOR EXAM: slurred speech yesterday
COMMENTS:

PROCEDURE:     CT  - CT HEAD WITHOUT CONTRAST  - September 27, 2012 [DATE]
RESULT:     Head CT dated 09/27/2012
TECHNIQUE: Helical noncontrasted 5 mm sections were obtained from skull base
to the vertex.

[Series 3: bone · axial · 0.49mm/px · z∈[+395,+530]mm · 16 of 31 slices shown, 20 images]
[im 2/31  brain]
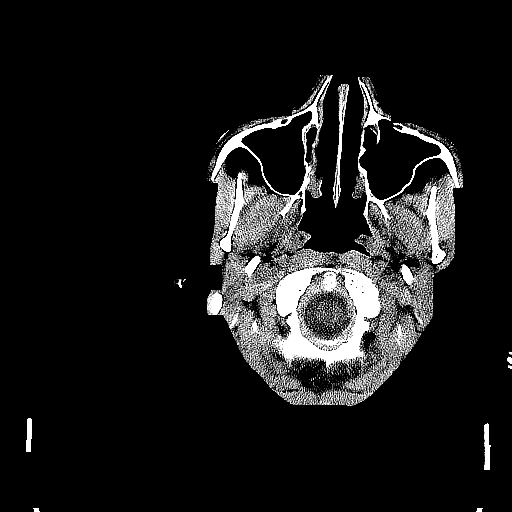
[im 2/31  bone]
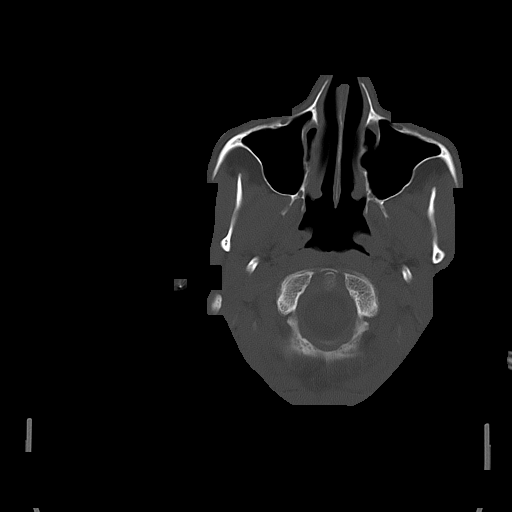
[im 4/31  brain]
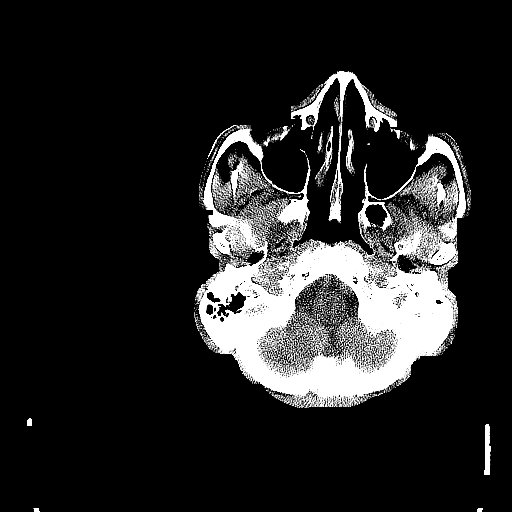
[im 6/31  brain]
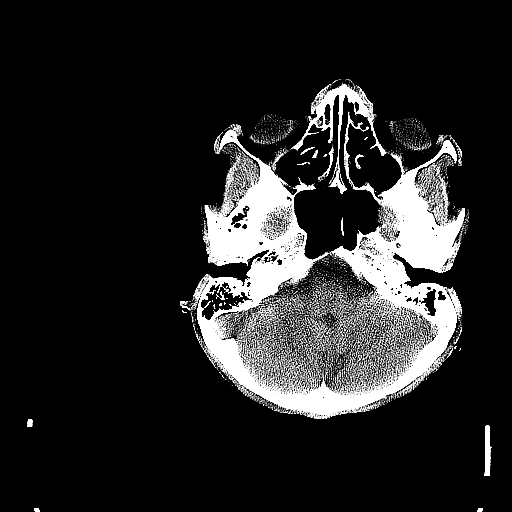
[im 8/31  brain]
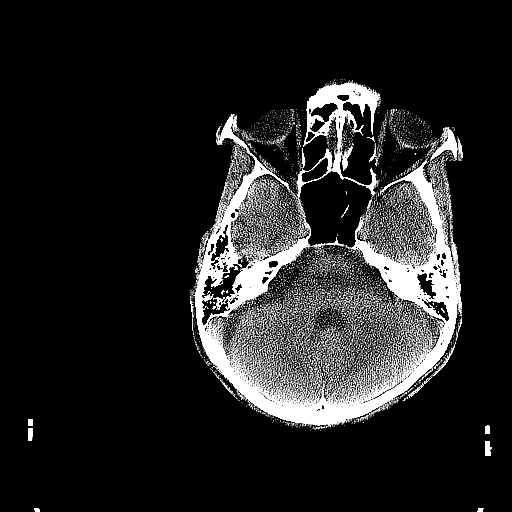
[im 9/31  brain]
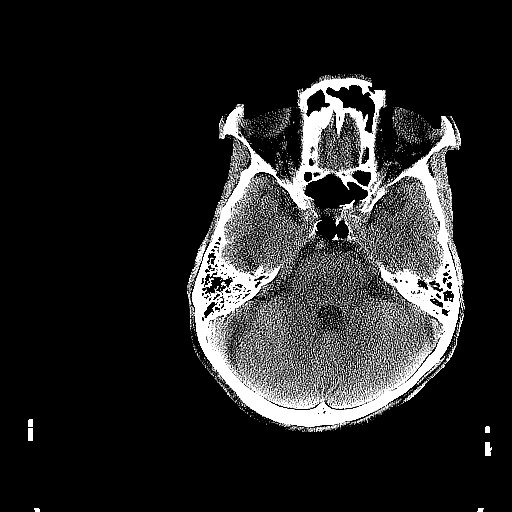
[im 9/31  bone]
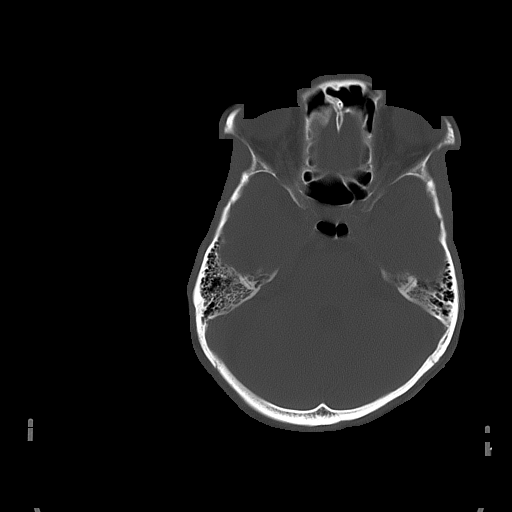
[im 11/31  brain]
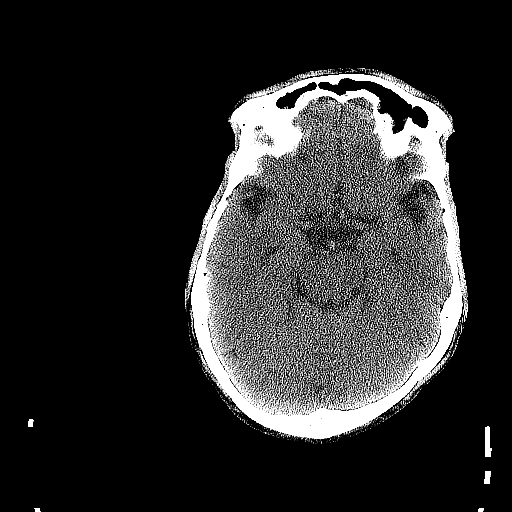
[im 13/31  brain]
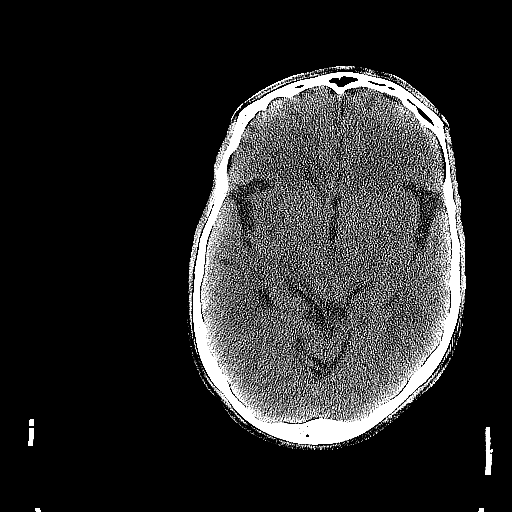
[im 15/31  brain]
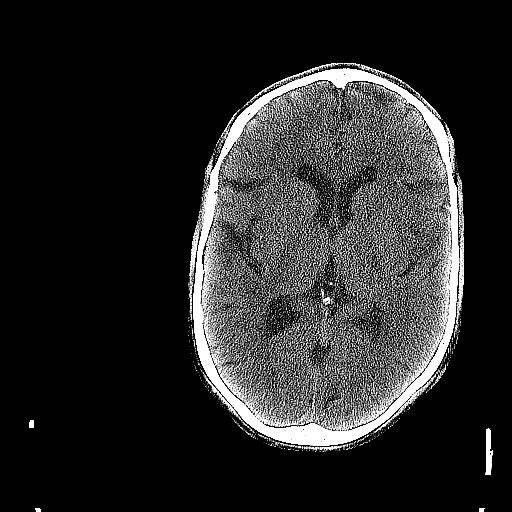
[im 16/31  brain]
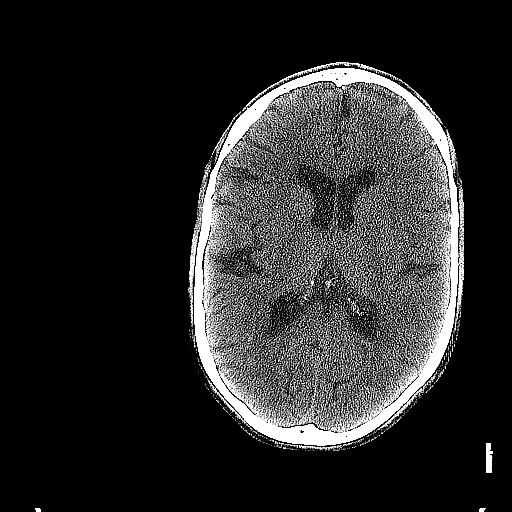
[im 16/31  bone]
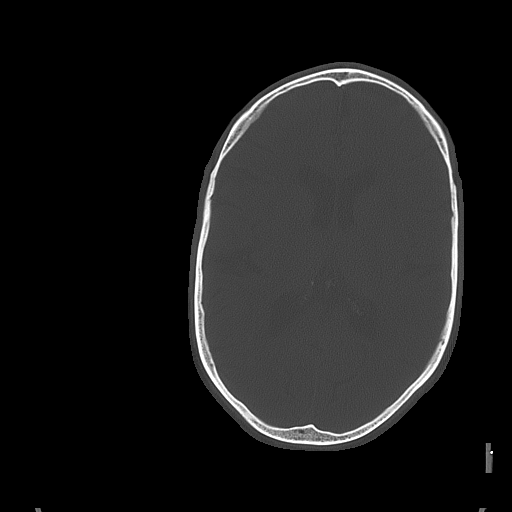
[im 18/31  brain]
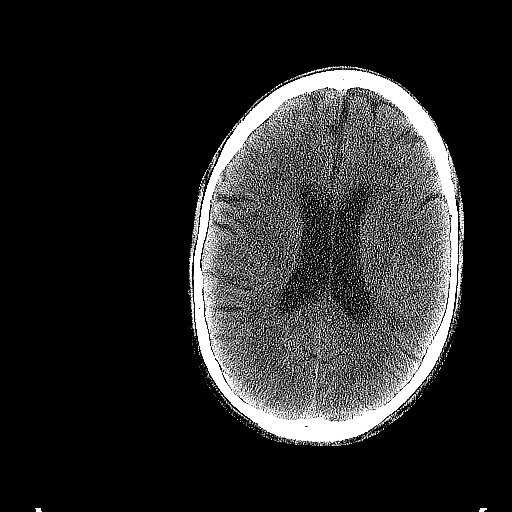
[im 20/31  brain]
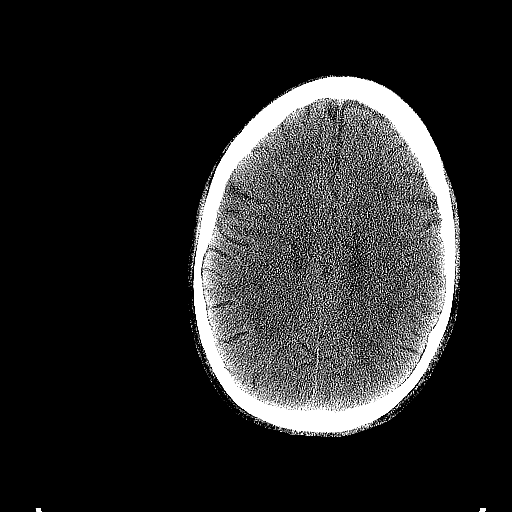
[im 22/31  brain]
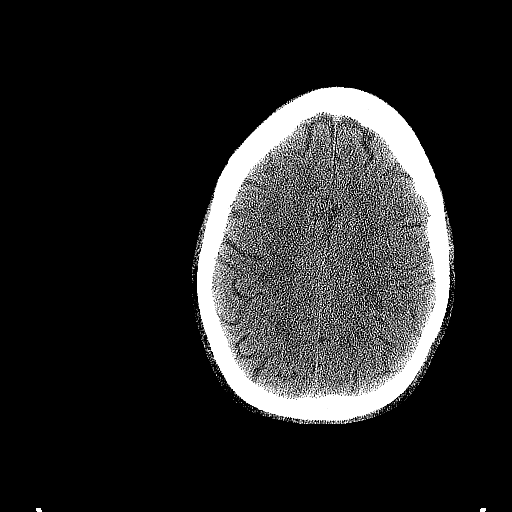
[im 23/31  brain]
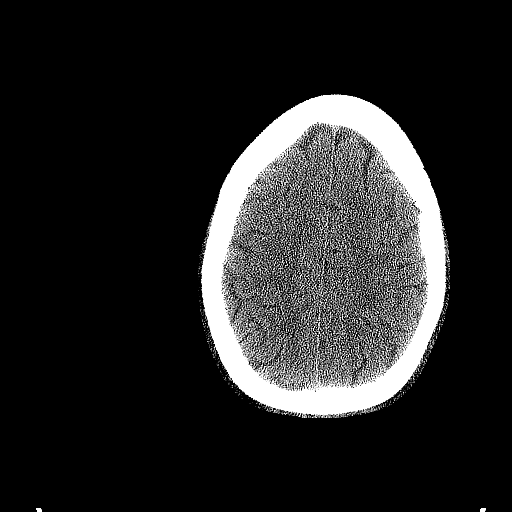
[im 23/31  bone]
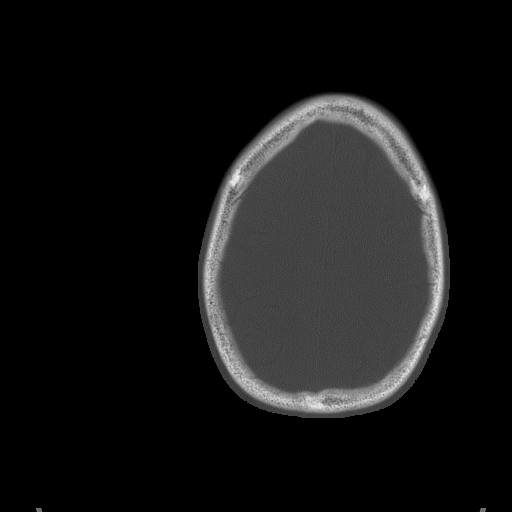
[im 25/31  brain]
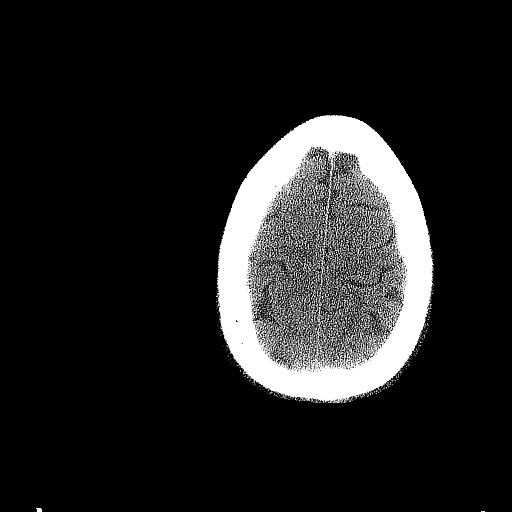
[im 27/31  brain]
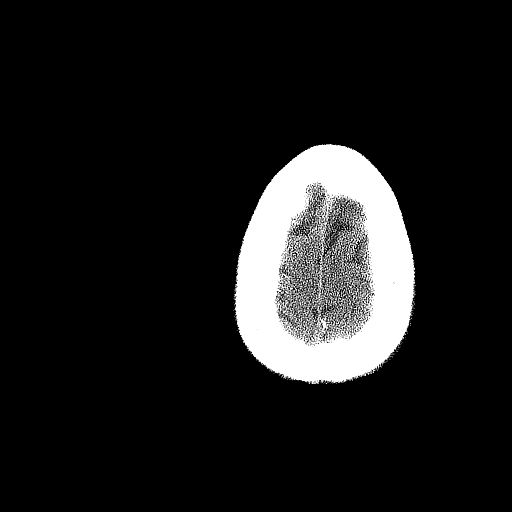
[im 29/31  brain]
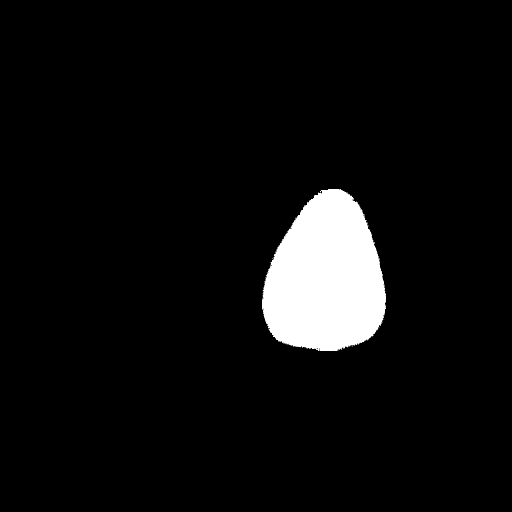

[16 of 30 positions shown; findings below may reference images not displayed]

FINDINGS: There is no evidence of intra-axial nor extra axial fluid
collections nor evidence of acute hemorrhage. Mild areas of low-attenuation
project within the subcortical, deep, and periventricular white matter
regions. There is no evidence of mass effect. There is no evidence of a
depressed skull fracture. Visualized paranasal sinuses and mastoid air cells
are patent.
IMPRESSION: Involutional changes without evidence of focal acute
abnormalities.

## 2014-07-15 IMAGING — CR DG CHEST 1V PORT
1 series · 1 of 1 positions shown · non-contrast
Comparison: none

REASON FOR EXAM: ETT placement
COMMENTS:

PROCEDURE:     DXR - DXR PORTABLE CHEST SINGLE VIEW  - September 28, 2012  [DATE]
RESULT:     Frontal view the chest dated 09/28/2012

[ap]
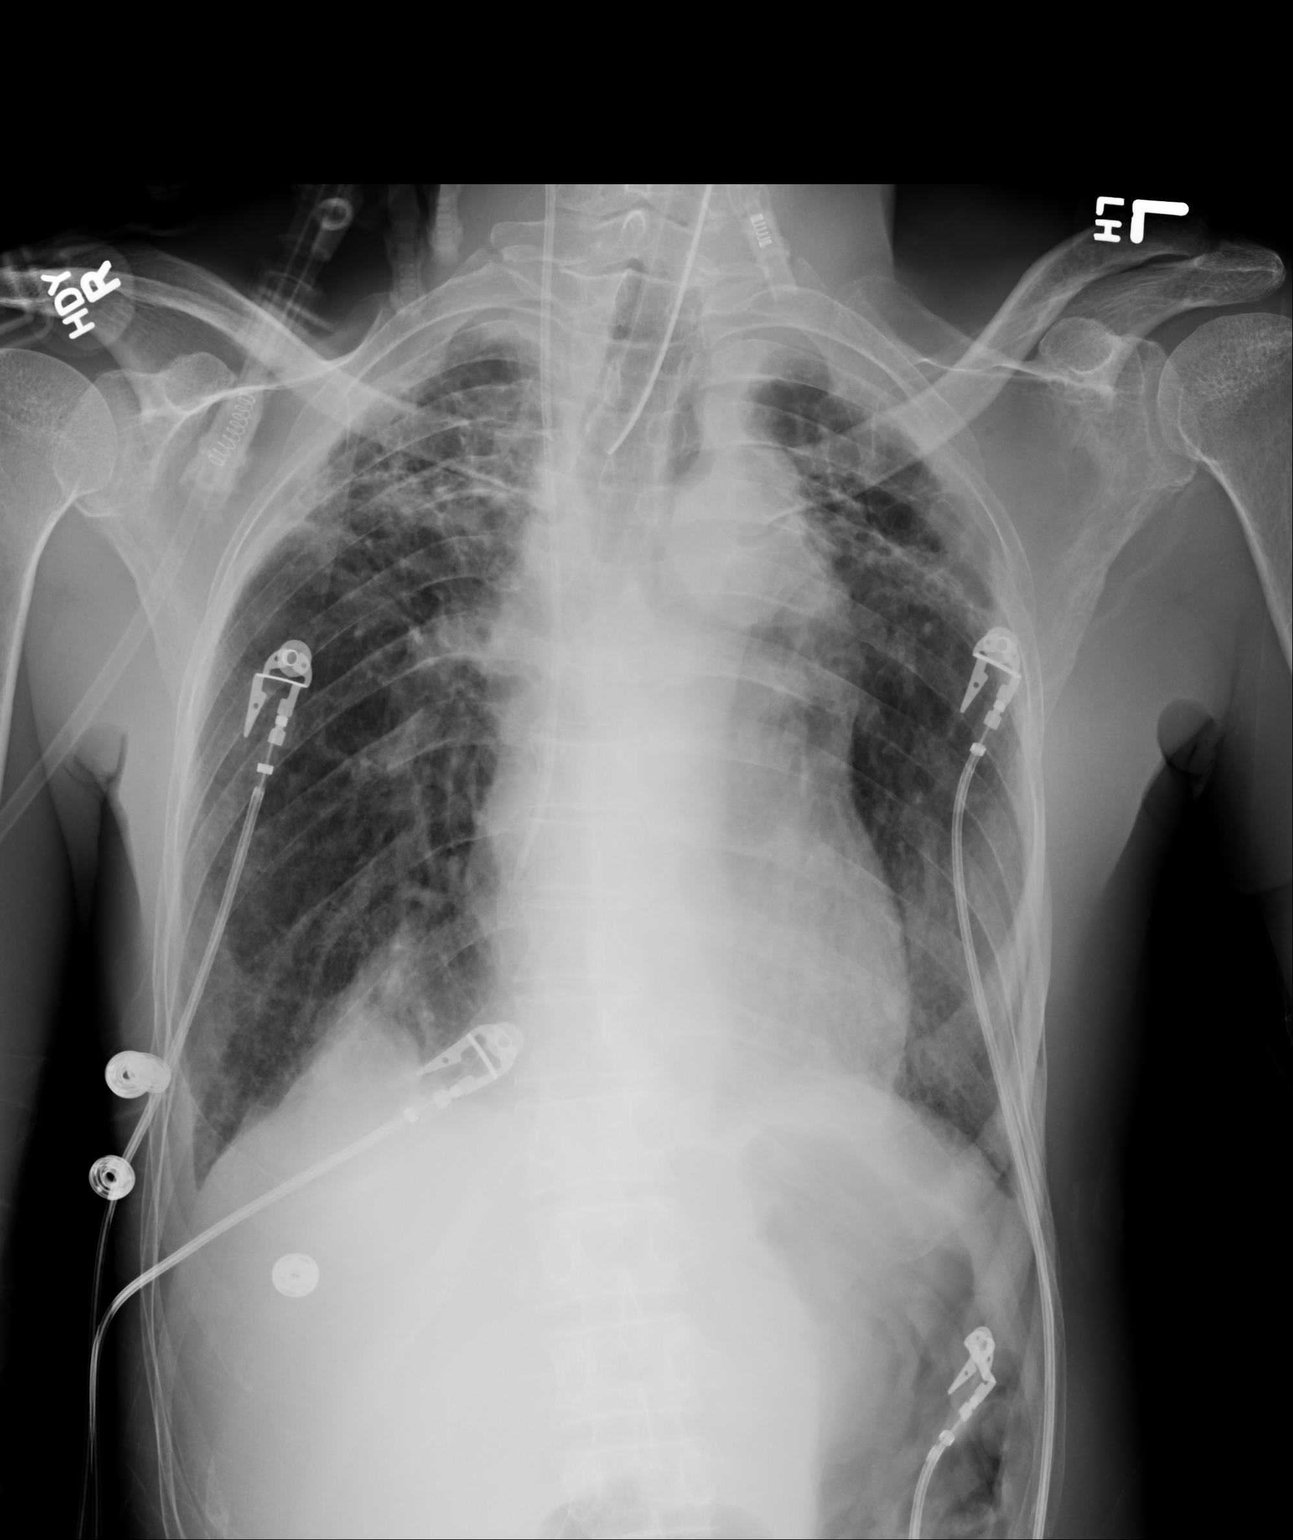

[1 of 1 positions shown; findings below may reference images not displayed]

FINDINGS: Endotracheal tube is appreciated with tip at level of clavicles. A
right-sided internal jugular catheter is appreciated with tip at the level
of the right atrium. Diffuse bilateral pulmonary opacities identified post
prominence of interstitial markings trickled within the lung apices. The
cardiac Silhouette is within normal limits. Increased density projects
within the right lung base.
IMPRESSION: Support lines and tubes as described above. Otherwise no
significant change in the chest radiograph when compared to previous study
dated 09/27/2012.

## 2014-07-17 IMAGING — CR DG CHEST 1V PORT
1 series · 1 of 1 positions shown · non-contrast
Comparison: none

REASON FOR EXAM: on vent, resp failure
COMMENTS:

[ap]
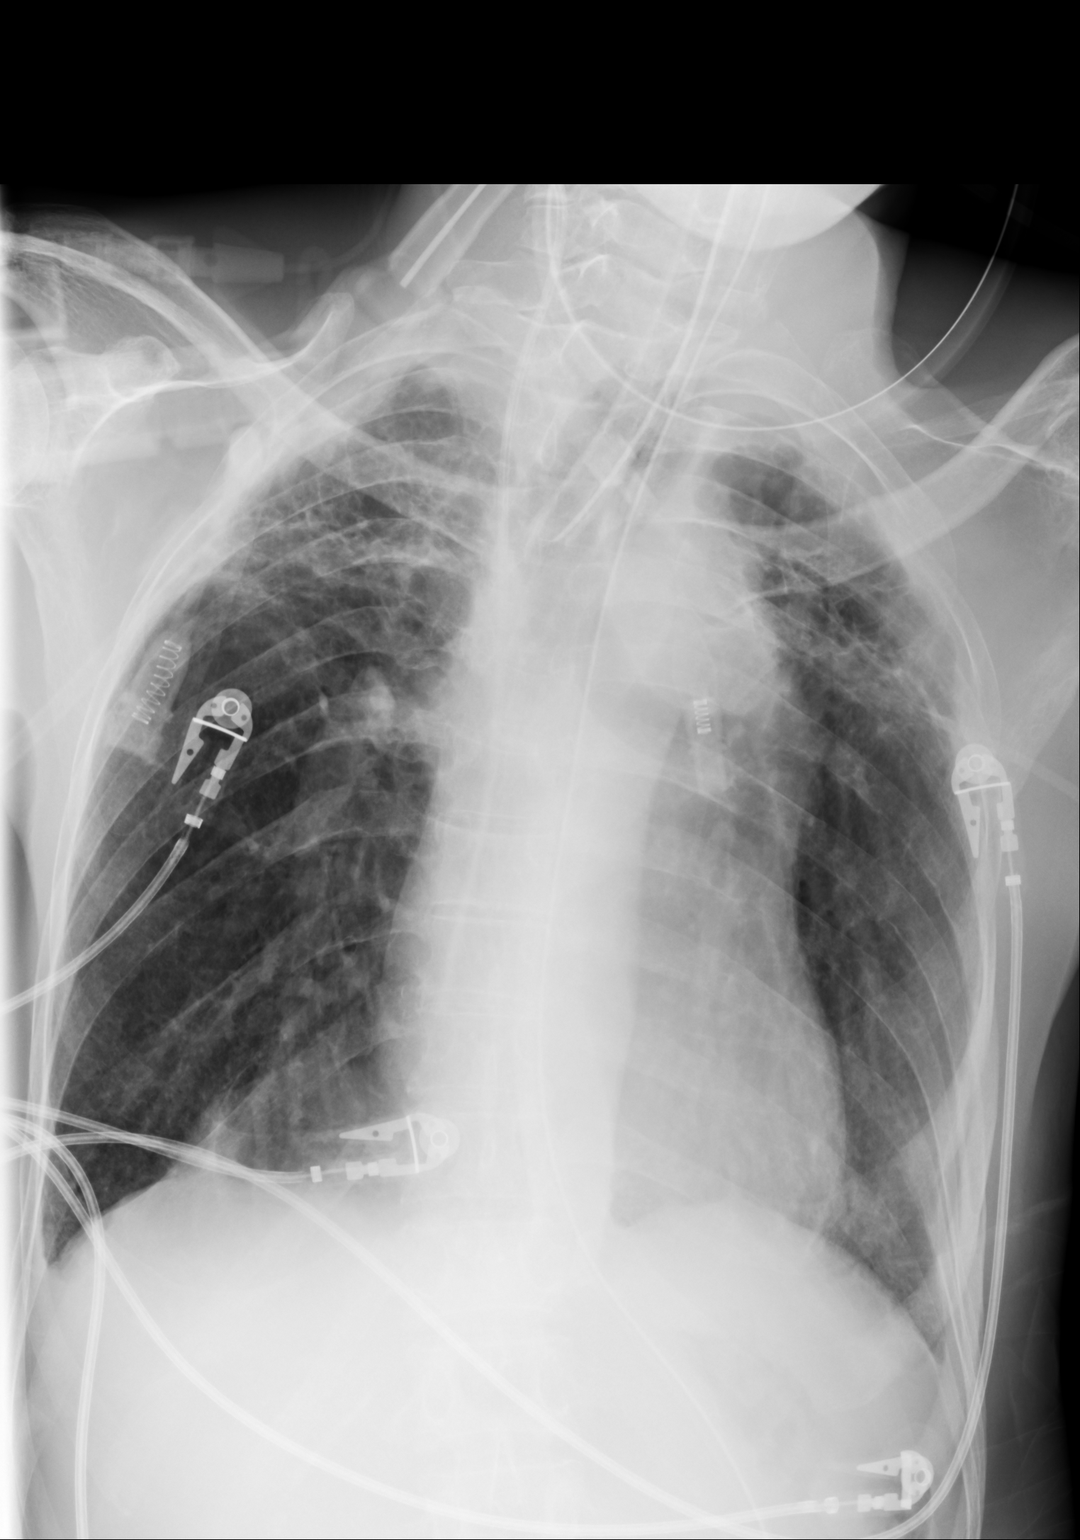

[1 of 1 positions shown; findings below may reference images not displayed]

PROCEDURE:     DXR - DXR PORTABLE CHEST SINGLE VIEW  - September 30, 2012  [DATE]

RESULT:     Comparison is made to the previous examination dated 09/29/2012.
Right jugular central venous catheter, endotracheal tube and nasogastric
tube remain in place and unchanged. COPD is present with bilateral apical
increased densities with elevation of the hilar structures consistent with
fibrosis. No developing density, infiltrate, significant effusion or
pneumothorax is evident. Bony structures are unremarkable.
IMPRESSION: Stable chest x-ray with COPD and bilateral apical fibrosis.

[REDACTED]
